# Patient Record
Sex: Female | Born: 1975 | Race: White | Hispanic: No | Marital: Married
Health system: Southern US, Community
[De-identification: ages and names within clinical notes are randomized; demographics above are authoritative.]

---

## 2014-12-27 ENCOUNTER — Other Ambulatory Visit: Payer: Self-pay | Admitting: Obstetrics and Gynecology

## 2014-12-27 DIAGNOSIS — R928 Other abnormal and inconclusive findings on diagnostic imaging of breast: Secondary | ICD-10-CM

## 2015-01-02 ENCOUNTER — Ambulatory Visit
Admission: RE | Admit: 2015-01-02 | Discharge: 2015-01-02 | Disposition: A | Discharge status: Home / Self Care | Payer: BLUE CROSS/BLUE SHIELD | Source: Ambulatory Visit | Attending: Obstetrics and Gynecology | Admitting: Obstetrics and Gynecology

## 2015-01-02 DIAGNOSIS — R928 Other abnormal and inconclusive findings on diagnostic imaging of breast: Secondary | ICD-10-CM

## 2018-01-15 DIAGNOSIS — Z124 Encounter for screening for malignant neoplasm of cervix: Secondary | ICD-10-CM | POA: Diagnosis not present

## 2018-01-15 DIAGNOSIS — Z01419 Encounter for gynecological examination (general) (routine) without abnormal findings: Secondary | ICD-10-CM | POA: Diagnosis not present

## 2018-01-15 DIAGNOSIS — Z1231 Encounter for screening mammogram for malignant neoplasm of breast: Secondary | ICD-10-CM | POA: Diagnosis not present

## 2018-01-15 DIAGNOSIS — Z130 Encounter for screening for diseases of the blood and blood-forming organs and certain disorders involving the immune mechanism: Secondary | ICD-10-CM | POA: Diagnosis not present

## 2018-01-15 DIAGNOSIS — R87610 Atypical squamous cells of undetermined significance on cytologic smear of cervix (ASC-US): Secondary | ICD-10-CM | POA: Diagnosis not present

## 2018-01-15 DIAGNOSIS — Z1151 Encounter for screening for human papillomavirus (HPV): Secondary | ICD-10-CM | POA: Diagnosis not present

## 2018-06-14 DIAGNOSIS — I10 Essential (primary) hypertension: Secondary | ICD-10-CM | POA: Diagnosis not present

## 2018-06-14 DIAGNOSIS — G8929 Other chronic pain: Secondary | ICD-10-CM | POA: Diagnosis not present

## 2018-06-14 DIAGNOSIS — M545 Low back pain: Secondary | ICD-10-CM | POA: Diagnosis not present

## 2018-06-14 DIAGNOSIS — F5101 Primary insomnia: Secondary | ICD-10-CM | POA: Diagnosis not present

## 2018-08-12 DIAGNOSIS — S51852A Open bite of left forearm, initial encounter: Secondary | ICD-10-CM | POA: Diagnosis not present

## 2018-08-12 DIAGNOSIS — Z23 Encounter for immunization: Secondary | ICD-10-CM | POA: Diagnosis not present

## 2018-08-13 DIAGNOSIS — S51852A Open bite of left forearm, initial encounter: Secondary | ICD-10-CM | POA: Diagnosis not present

## 2018-08-17 DIAGNOSIS — S51852A Open bite of left forearm, initial encounter: Secondary | ICD-10-CM | POA: Diagnosis not present

## 2018-08-26 DIAGNOSIS — R61 Generalized hyperhidrosis: Secondary | ICD-10-CM | POA: Diagnosis not present

## 2018-09-22 DIAGNOSIS — Z30433 Encounter for removal and reinsertion of intrauterine contraceptive device: Secondary | ICD-10-CM | POA: Diagnosis not present

## 2018-11-03 DIAGNOSIS — Z0000 Encounter for general adult medical examination without abnormal findings: Secondary | ICD-10-CM | POA: Diagnosis not present

## 2018-11-19 DIAGNOSIS — Z8249 Family history of ischemic heart disease and other diseases of the circulatory system: Secondary | ICD-10-CM | POA: Diagnosis not present

## 2018-11-19 DIAGNOSIS — I10 Essential (primary) hypertension: Secondary | ICD-10-CM | POA: Diagnosis not present

## 2018-12-15 ENCOUNTER — Other Ambulatory Visit: Payer: Self-pay

## 2018-12-15 DIAGNOSIS — Z20822 Contact with and (suspected) exposure to covid-19: Secondary | ICD-10-CM

## 2018-12-17 LAB — NOVEL CORONAVIRUS, NAA: SARS-CoV-2, NAA: NOT DETECTED

## 2019-03-17 DIAGNOSIS — Z130 Encounter for screening for diseases of the blood and blood-forming organs and certain disorders involving the immune mechanism: Secondary | ICD-10-CM | POA: Diagnosis not present

## 2019-03-17 DIAGNOSIS — Z1389 Encounter for screening for other disorder: Secondary | ICD-10-CM | POA: Diagnosis not present

## 2019-03-17 DIAGNOSIS — Z1231 Encounter for screening mammogram for malignant neoplasm of breast: Secondary | ICD-10-CM | POA: Diagnosis not present

## 2019-03-17 DIAGNOSIS — Z01419 Encounter for gynecological examination (general) (routine) without abnormal findings: Secondary | ICD-10-CM | POA: Diagnosis not present

## 2019-03-17 DIAGNOSIS — Z6824 Body mass index (BMI) 24.0-24.9, adult: Secondary | ICD-10-CM | POA: Diagnosis not present

## 2019-03-25 ENCOUNTER — Other Ambulatory Visit: Payer: Self-pay | Admitting: Obstetrics and Gynecology

## 2019-03-25 DIAGNOSIS — N6310 Unspecified lump in the right breast, unspecified quadrant: Secondary | ICD-10-CM

## 2019-04-06 DIAGNOSIS — Z85828 Personal history of other malignant neoplasm of skin: Secondary | ICD-10-CM | POA: Diagnosis not present

## 2019-04-06 DIAGNOSIS — L814 Other melanin hyperpigmentation: Secondary | ICD-10-CM | POA: Diagnosis not present

## 2019-04-06 DIAGNOSIS — D225 Melanocytic nevi of trunk: Secondary | ICD-10-CM | POA: Diagnosis not present

## 2019-04-06 DIAGNOSIS — L578 Other skin changes due to chronic exposure to nonionizing radiation: Secondary | ICD-10-CM | POA: Diagnosis not present

## 2019-04-06 DIAGNOSIS — L111 Transient acantholytic dermatosis [Grover]: Secondary | ICD-10-CM | POA: Diagnosis not present

## 2019-04-06 DIAGNOSIS — D485 Neoplasm of uncertain behavior of skin: Secondary | ICD-10-CM | POA: Diagnosis not present

## 2019-04-08 ENCOUNTER — Ambulatory Visit: Payer: BLUE CROSS/BLUE SHIELD | Attending: Internal Medicine

## 2019-04-08 DIAGNOSIS — Z23 Encounter for immunization: Secondary | ICD-10-CM

## 2019-04-08 NOTE — Progress Notes (Signed)
   Covid-19 Vaccination Clinic  Name:  Cheryl Zuniga    MRN: [id number] DOB: September 03, 1975  04/08/2019  [name] was observed post Covid-19 immunization for 15 minutes without incident. She was provided with Vaccine Information Sheet and instruction to access the V-Safe system.   [name] was instructed to call 911 with any severe reactions post vaccine: Marland Kitchen Difficulty breathing  . Swelling of face and throat  . A fast heartbeat  . A bad rash all over body  . Dizziness and weakness   Immunizations Administered    Name Date Dose VIS Date Route   Pfizer COVID-19 Vaccine 04/08/2019 10:55 AM 0.3 mL 12/24/2018 Intramuscular   Manufacturer: [employer], [employer]   Lot: [id number]   NDC: [address]-7

## 2019-04-15 DIAGNOSIS — M545 Low back pain: Secondary | ICD-10-CM | POA: Diagnosis not present

## 2019-04-15 DIAGNOSIS — M542 Cervicalgia: Secondary | ICD-10-CM | POA: Diagnosis not present

## 2019-04-19 ENCOUNTER — Ambulatory Visit
Admission: RE | Admit: 2019-04-19 | Discharge: 2019-04-19 | Disposition: A | Discharge status: Home / Self Care | Payer: BLUE CROSS/BLUE SHIELD | Source: Ambulatory Visit | Attending: Obstetrics and Gynecology | Admitting: Obstetrics and Gynecology

## 2019-04-19 ENCOUNTER — Other Ambulatory Visit: Payer: Self-pay

## 2019-04-19 ENCOUNTER — Ambulatory Visit
Admission: RE | Admit: 2019-04-19 | Discharge: 2019-04-19 | Disposition: A | Discharge status: Home / Self Care | Payer: BC Managed Care – PPO | Source: Ambulatory Visit | Attending: Obstetrics and Gynecology | Admitting: Obstetrics and Gynecology

## 2019-04-19 DIAGNOSIS — R922 Inconclusive mammogram: Secondary | ICD-10-CM | POA: Diagnosis not present

## 2019-04-19 DIAGNOSIS — N6310 Unspecified lump in the right breast, unspecified quadrant: Secondary | ICD-10-CM

## 2019-04-19 DIAGNOSIS — N6001 Solitary cyst of right breast: Secondary | ICD-10-CM | POA: Diagnosis not present

## 2019-05-02 ENCOUNTER — Ambulatory Visit: Payer: BC Managed Care – PPO | Attending: Internal Medicine

## 2019-05-02 DIAGNOSIS — Z23 Encounter for immunization: Secondary | ICD-10-CM

## 2019-05-02 NOTE — Progress Notes (Signed)
   Covid-19 Vaccination Clinic  Name:  Cheryl Zuniga    MRN: [id number] DOB: 1975/09/25  05/02/2019  [name] was observed post Covid-19 immunization for 15 minutes without incident. She was provided with Vaccine Information Sheet and instruction to access the V-Safe system.   [name] was instructed to call 911 with any severe reactions post vaccine: Marland Kitchen Difficulty breathing  . Swelling of face and throat  . A fast heartbeat  . A bad rash all over body  . Dizziness and weakness   Immunizations Administered    Name Date Dose VIS Date Route   Pfizer COVID-19 Vaccine 05/02/2019  3:31 PM 0.3 mL 03/09/2018 Intramuscular   Manufacturer: [employer], [employer]   Lot: [id number]   NDC: [address]-2

## 2019-08-17 DIAGNOSIS — M9903 Segmental and somatic dysfunction of lumbar region: Secondary | ICD-10-CM | POA: Diagnosis not present

## 2019-08-17 DIAGNOSIS — M50322 Other cervical disc degeneration at C5-C6 level: Secondary | ICD-10-CM | POA: Diagnosis not present

## 2019-08-17 DIAGNOSIS — M9905 Segmental and somatic dysfunction of pelvic region: Secondary | ICD-10-CM | POA: Diagnosis not present

## 2019-08-17 DIAGNOSIS — M9902 Segmental and somatic dysfunction of thoracic region: Secondary | ICD-10-CM | POA: Diagnosis not present

## 2019-08-18 DIAGNOSIS — M50322 Other cervical disc degeneration at C5-C6 level: Secondary | ICD-10-CM | POA: Diagnosis not present

## 2019-08-18 DIAGNOSIS — M9902 Segmental and somatic dysfunction of thoracic region: Secondary | ICD-10-CM | POA: Diagnosis not present

## 2019-08-18 DIAGNOSIS — M9903 Segmental and somatic dysfunction of lumbar region: Secondary | ICD-10-CM | POA: Diagnosis not present

## 2019-08-18 DIAGNOSIS — M9905 Segmental and somatic dysfunction of pelvic region: Secondary | ICD-10-CM | POA: Diagnosis not present

## 2019-08-19 DIAGNOSIS — M9902 Segmental and somatic dysfunction of thoracic region: Secondary | ICD-10-CM | POA: Diagnosis not present

## 2019-08-19 DIAGNOSIS — M9905 Segmental and somatic dysfunction of pelvic region: Secondary | ICD-10-CM | POA: Diagnosis not present

## 2019-08-19 DIAGNOSIS — M50322 Other cervical disc degeneration at C5-C6 level: Secondary | ICD-10-CM | POA: Diagnosis not present

## 2019-08-19 DIAGNOSIS — M9903 Segmental and somatic dysfunction of lumbar region: Secondary | ICD-10-CM | POA: Diagnosis not present

## 2019-08-22 DIAGNOSIS — M50322 Other cervical disc degeneration at C5-C6 level: Secondary | ICD-10-CM | POA: Diagnosis not present

## 2019-08-22 DIAGNOSIS — M9903 Segmental and somatic dysfunction of lumbar region: Secondary | ICD-10-CM | POA: Diagnosis not present

## 2019-08-22 DIAGNOSIS — M9902 Segmental and somatic dysfunction of thoracic region: Secondary | ICD-10-CM | POA: Diagnosis not present

## 2019-08-22 DIAGNOSIS — M9905 Segmental and somatic dysfunction of pelvic region: Secondary | ICD-10-CM | POA: Diagnosis not present

## 2019-08-24 DIAGNOSIS — M50322 Other cervical disc degeneration at C5-C6 level: Secondary | ICD-10-CM | POA: Diagnosis not present

## 2019-08-24 DIAGNOSIS — M9905 Segmental and somatic dysfunction of pelvic region: Secondary | ICD-10-CM | POA: Diagnosis not present

## 2019-08-24 DIAGNOSIS — M9902 Segmental and somatic dysfunction of thoracic region: Secondary | ICD-10-CM | POA: Diagnosis not present

## 2019-08-24 DIAGNOSIS — M9903 Segmental and somatic dysfunction of lumbar region: Secondary | ICD-10-CM | POA: Diagnosis not present

## 2019-08-26 DIAGNOSIS — M50322 Other cervical disc degeneration at C5-C6 level: Secondary | ICD-10-CM | POA: Diagnosis not present

## 2019-08-26 DIAGNOSIS — M9905 Segmental and somatic dysfunction of pelvic region: Secondary | ICD-10-CM | POA: Diagnosis not present

## 2019-08-26 DIAGNOSIS — M9902 Segmental and somatic dysfunction of thoracic region: Secondary | ICD-10-CM | POA: Diagnosis not present

## 2019-08-26 DIAGNOSIS — M9903 Segmental and somatic dysfunction of lumbar region: Secondary | ICD-10-CM | POA: Diagnosis not present

## 2019-08-29 DIAGNOSIS — M9902 Segmental and somatic dysfunction of thoracic region: Secondary | ICD-10-CM | POA: Diagnosis not present

## 2019-08-29 DIAGNOSIS — M50322 Other cervical disc degeneration at C5-C6 level: Secondary | ICD-10-CM | POA: Diagnosis not present

## 2019-08-29 DIAGNOSIS — M9905 Segmental and somatic dysfunction of pelvic region: Secondary | ICD-10-CM | POA: Diagnosis not present

## 2019-08-29 DIAGNOSIS — M9903 Segmental and somatic dysfunction of lumbar region: Secondary | ICD-10-CM | POA: Diagnosis not present

## 2019-08-31 DIAGNOSIS — R739 Hyperglycemia, unspecified: Secondary | ICD-10-CM | POA: Diagnosis not present

## 2019-08-31 DIAGNOSIS — F5101 Primary insomnia: Secondary | ICD-10-CM | POA: Diagnosis not present

## 2019-08-31 DIAGNOSIS — G8929 Other chronic pain: Secondary | ICD-10-CM | POA: Diagnosis not present

## 2019-08-31 DIAGNOSIS — I10 Essential (primary) hypertension: Secondary | ICD-10-CM | POA: Diagnosis not present

## 2019-09-01 DIAGNOSIS — M50322 Other cervical disc degeneration at C5-C6 level: Secondary | ICD-10-CM | POA: Diagnosis not present

## 2019-09-01 DIAGNOSIS — M9905 Segmental and somatic dysfunction of pelvic region: Secondary | ICD-10-CM | POA: Diagnosis not present

## 2019-09-01 DIAGNOSIS — M9903 Segmental and somatic dysfunction of lumbar region: Secondary | ICD-10-CM | POA: Diagnosis not present

## 2019-09-01 DIAGNOSIS — M9902 Segmental and somatic dysfunction of thoracic region: Secondary | ICD-10-CM | POA: Diagnosis not present

## 2019-09-02 DIAGNOSIS — M9903 Segmental and somatic dysfunction of lumbar region: Secondary | ICD-10-CM | POA: Diagnosis not present

## 2019-09-02 DIAGNOSIS — M9905 Segmental and somatic dysfunction of pelvic region: Secondary | ICD-10-CM | POA: Diagnosis not present

## 2019-09-02 DIAGNOSIS — M9902 Segmental and somatic dysfunction of thoracic region: Secondary | ICD-10-CM | POA: Diagnosis not present

## 2019-09-02 DIAGNOSIS — M50322 Other cervical disc degeneration at C5-C6 level: Secondary | ICD-10-CM | POA: Diagnosis not present

## 2019-09-05 DIAGNOSIS — M9903 Segmental and somatic dysfunction of lumbar region: Secondary | ICD-10-CM | POA: Diagnosis not present

## 2019-09-05 DIAGNOSIS — M9902 Segmental and somatic dysfunction of thoracic region: Secondary | ICD-10-CM | POA: Diagnosis not present

## 2019-09-05 DIAGNOSIS — M9905 Segmental and somatic dysfunction of pelvic region: Secondary | ICD-10-CM | POA: Diagnosis not present

## 2019-09-05 DIAGNOSIS — M50322 Other cervical disc degeneration at C5-C6 level: Secondary | ICD-10-CM | POA: Diagnosis not present

## 2019-09-07 DIAGNOSIS — M9903 Segmental and somatic dysfunction of lumbar region: Secondary | ICD-10-CM | POA: Diagnosis not present

## 2019-09-07 DIAGNOSIS — M9905 Segmental and somatic dysfunction of pelvic region: Secondary | ICD-10-CM | POA: Diagnosis not present

## 2019-09-07 DIAGNOSIS — M9902 Segmental and somatic dysfunction of thoracic region: Secondary | ICD-10-CM | POA: Diagnosis not present

## 2019-09-07 DIAGNOSIS — M50322 Other cervical disc degeneration at C5-C6 level: Secondary | ICD-10-CM | POA: Diagnosis not present

## 2019-09-20 DIAGNOSIS — M9902 Segmental and somatic dysfunction of thoracic region: Secondary | ICD-10-CM | POA: Diagnosis not present

## 2019-09-20 DIAGNOSIS — M50322 Other cervical disc degeneration at C5-C6 level: Secondary | ICD-10-CM | POA: Diagnosis not present

## 2019-09-20 DIAGNOSIS — M9905 Segmental and somatic dysfunction of pelvic region: Secondary | ICD-10-CM | POA: Diagnosis not present

## 2019-09-20 DIAGNOSIS — M9903 Segmental and somatic dysfunction of lumbar region: Secondary | ICD-10-CM | POA: Diagnosis not present

## 2019-10-06 DIAGNOSIS — M9905 Segmental and somatic dysfunction of pelvic region: Secondary | ICD-10-CM | POA: Diagnosis not present

## 2019-10-06 DIAGNOSIS — M50322 Other cervical disc degeneration at C5-C6 level: Secondary | ICD-10-CM | POA: Diagnosis not present

## 2019-10-06 DIAGNOSIS — M9903 Segmental and somatic dysfunction of lumbar region: Secondary | ICD-10-CM | POA: Diagnosis not present

## 2019-10-06 DIAGNOSIS — M9902 Segmental and somatic dysfunction of thoracic region: Secondary | ICD-10-CM | POA: Diagnosis not present

## 2020-03-02 DIAGNOSIS — I10 Essential (primary) hypertension: Secondary | ICD-10-CM | POA: Diagnosis not present

## 2020-03-02 DIAGNOSIS — F5101 Primary insomnia: Secondary | ICD-10-CM | POA: Diagnosis not present

## 2020-03-02 DIAGNOSIS — G8929 Other chronic pain: Secondary | ICD-10-CM | POA: Diagnosis not present

## 2020-04-11 DIAGNOSIS — Z6824 Body mass index (BMI) 24.0-24.9, adult: Secondary | ICD-10-CM | POA: Diagnosis not present

## 2020-04-11 DIAGNOSIS — Z1389 Encounter for screening for other disorder: Secondary | ICD-10-CM | POA: Diagnosis not present

## 2020-04-11 DIAGNOSIS — Z130 Encounter for screening for diseases of the blood and blood-forming organs and certain disorders involving the immune mechanism: Secondary | ICD-10-CM | POA: Diagnosis not present

## 2020-04-11 DIAGNOSIS — Z01419 Encounter for gynecological examination (general) (routine) without abnormal findings: Secondary | ICD-10-CM | POA: Diagnosis not present

## 2020-04-11 DIAGNOSIS — Z1231 Encounter for screening mammogram for malignant neoplasm of breast: Secondary | ICD-10-CM | POA: Diagnosis not present

## 2020-04-26 DIAGNOSIS — D1801 Hemangioma of skin and subcutaneous tissue: Secondary | ICD-10-CM | POA: Diagnosis not present

## 2020-04-26 DIAGNOSIS — L905 Scar conditions and fibrosis of skin: Secondary | ICD-10-CM | POA: Diagnosis not present

## 2020-04-26 DIAGNOSIS — L111 Transient acantholytic dermatosis [Grover]: Secondary | ICD-10-CM | POA: Diagnosis not present

## 2020-04-26 DIAGNOSIS — L814 Other melanin hyperpigmentation: Secondary | ICD-10-CM | POA: Diagnosis not present

## 2020-08-31 DIAGNOSIS — R739 Hyperglycemia, unspecified: Secondary | ICD-10-CM | POA: Diagnosis not present

## 2020-08-31 DIAGNOSIS — I10 Essential (primary) hypertension: Secondary | ICD-10-CM | POA: Diagnosis not present

## 2020-08-31 DIAGNOSIS — F5101 Primary insomnia: Secondary | ICD-10-CM | POA: Diagnosis not present

## 2020-08-31 DIAGNOSIS — G8929 Other chronic pain: Secondary | ICD-10-CM | POA: Diagnosis not present

## 2020-08-31 DIAGNOSIS — R002 Palpitations: Secondary | ICD-10-CM | POA: Diagnosis not present

## 2020-09-05 DIAGNOSIS — A630 Anogenital (venereal) warts: Secondary | ICD-10-CM | POA: Diagnosis not present

## 2020-09-07 IMAGING — MG MM DIGITAL DIAGNOSTIC UNILAT*R* W/ TOMO W/ CAD
4 series · 4 of 12 positions shown · non-contrast
Comparison: Previous exam(s).

CLINICAL DATA: Screening recall for a possible right breast mass.

EXAM:
DIGITAL DIAGNOSTIC RIGHT MAMMOGRAM WITH CAD AND TOMO
ULTRASOUND RIGHT BREAST

[R ML synth-2D]
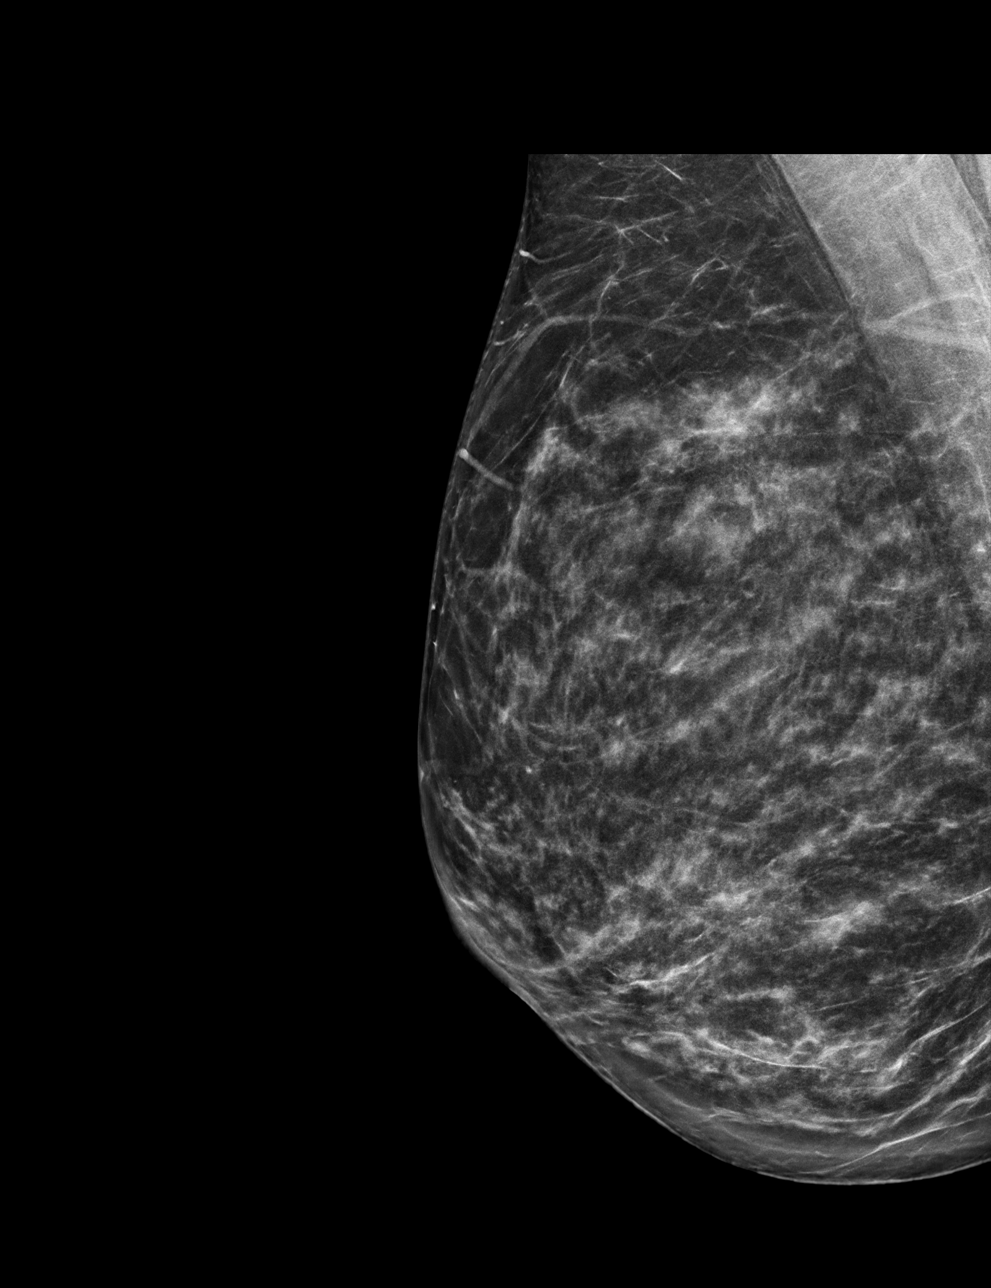

[R CC synth-2D]
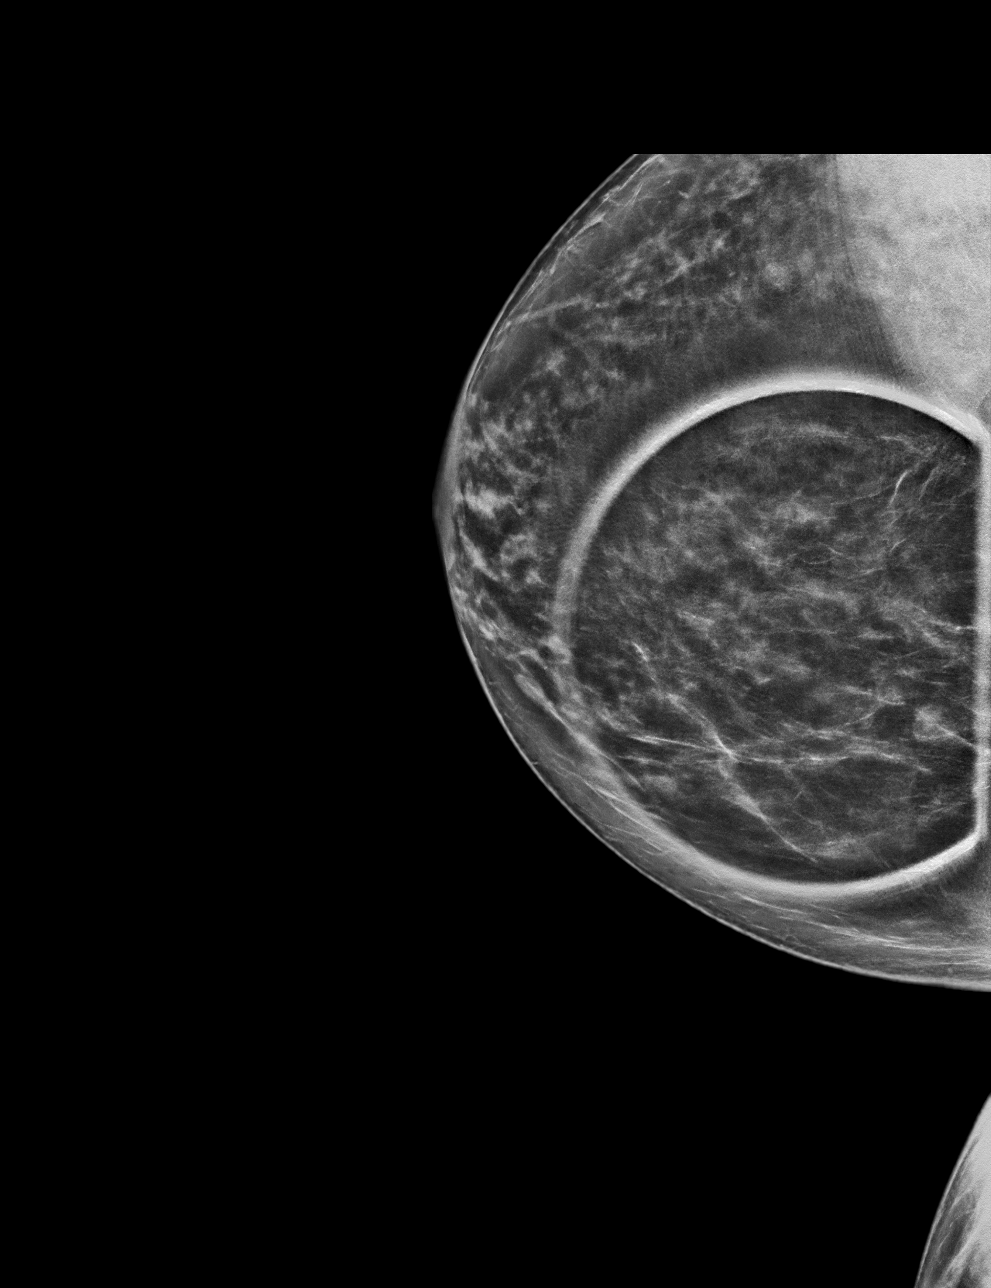

[R CC tomo · tomo slice 41/82.0]
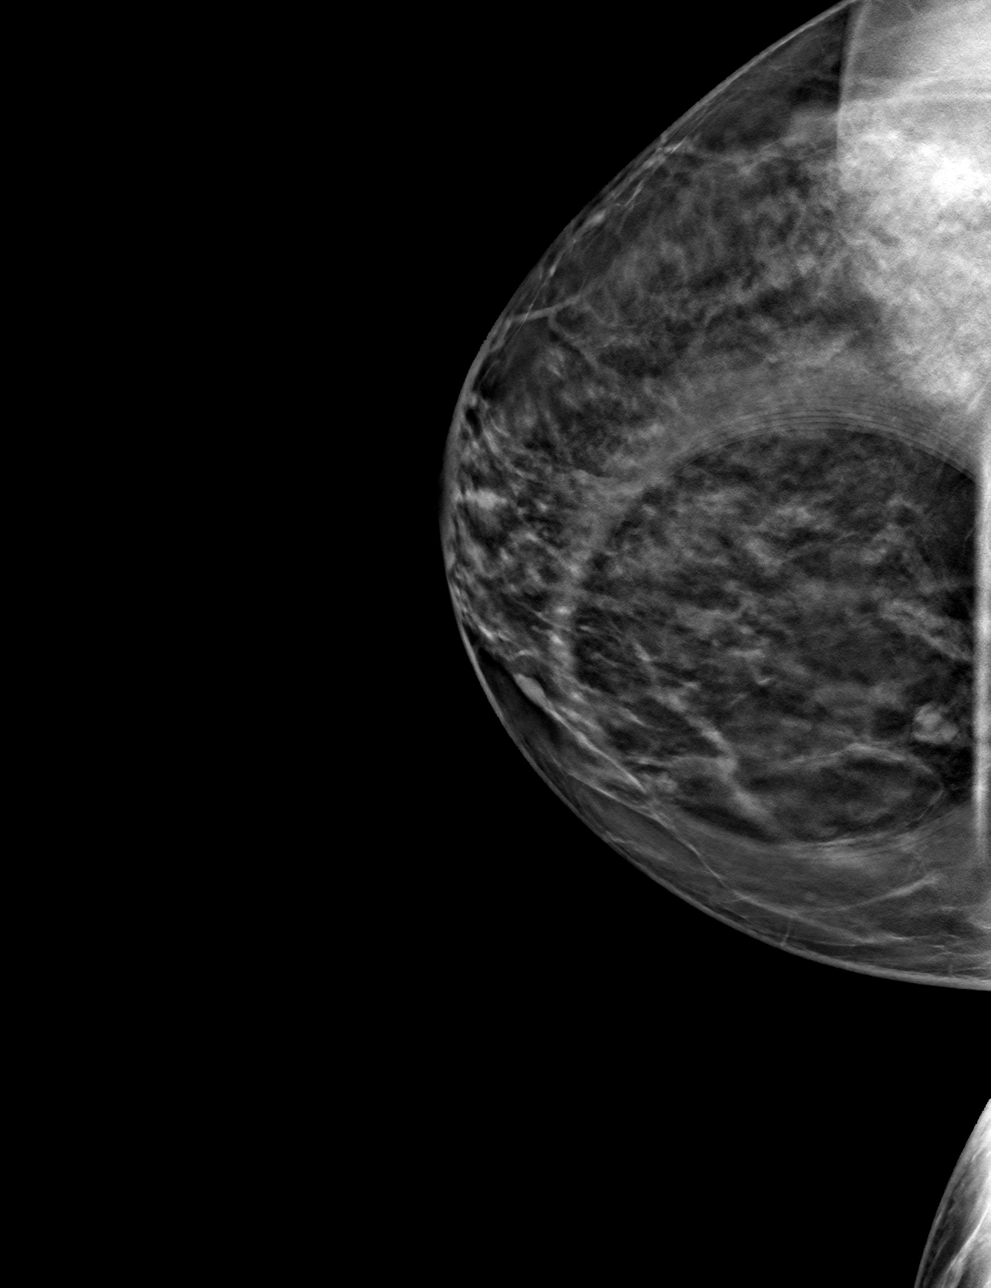

[R ML tomo · tomo slice 38/75.0]
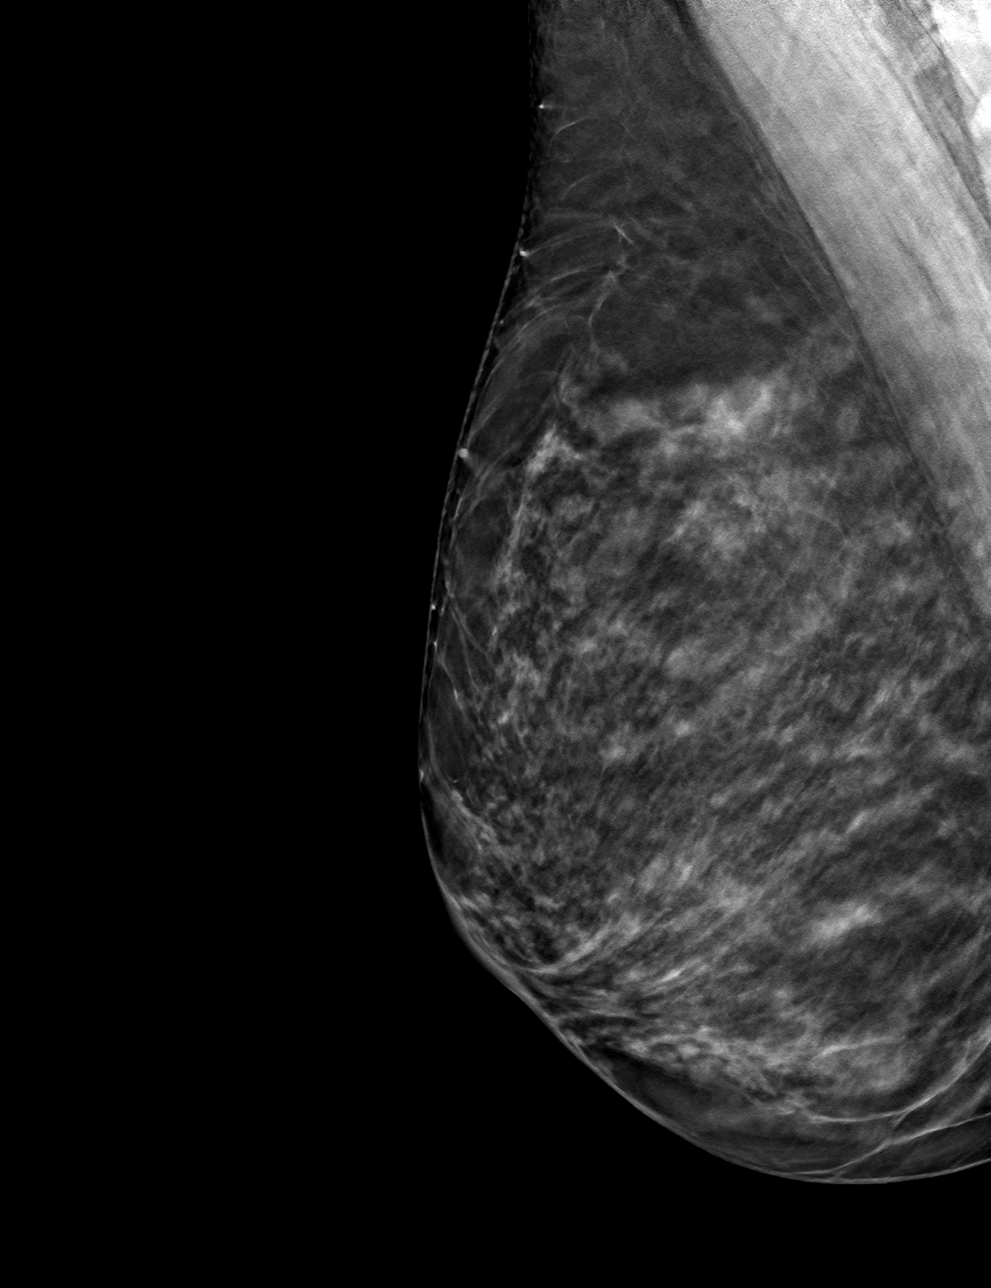

[4 of 12 positions shown; findings below may reference images not displayed]

ACR Breast Density Category c: The breast tissue is heterogeneously
dense, which may obscure small masses.
FINDINGS: Spot compression tomosynthesis images through the medial right
breast demonstrate a persistent round circumscribed mass measuring
approximately 7 mm.

Mammographic images were processed with CAD.

Ultrasound targeted to the right breast at 1 o'clock, 7 cm from the
nipple demonstrates an anechoic oval circumscribed mass measuring 9
x 5 x 8 mm.
IMPRESSION: The mass in the upper inner quadrant of the right breast corresponds
with a benign cyst.

RECOMMENDATION:
Screening mammogram in one year.(Code:[id number])

I have discussed the findings and recommendations with the patient.
If applicable, a reminder letter will be sent to the patient
regarding the next appointment.

BI-RADS CATEGORY  2: Benign.

## 2020-09-26 DIAGNOSIS — E875 Hyperkalemia: Secondary | ICD-10-CM | POA: Diagnosis not present

## 2020-10-26 DIAGNOSIS — D485 Neoplasm of uncertain behavior of skin: Secondary | ICD-10-CM | POA: Diagnosis not present

## 2020-10-26 DIAGNOSIS — L570 Actinic keratosis: Secondary | ICD-10-CM | POA: Diagnosis not present

## 2020-10-26 DIAGNOSIS — D2372 Other benign neoplasm of skin of left lower limb, including hip: Secondary | ICD-10-CM | POA: Diagnosis not present

## 2021-01-03 ENCOUNTER — Other Ambulatory Visit: Payer: Self-pay

## 2021-01-03 ENCOUNTER — Other Ambulatory Visit: Payer: Self-pay | Admitting: Family Medicine

## 2021-01-03 ENCOUNTER — Ambulatory Visit
Admission: RE | Admit: 2021-01-03 | Discharge: 2021-01-03 | Disposition: A | Discharge status: Home / Self Care | Payer: BC Managed Care – PPO | Source: Ambulatory Visit | Attending: Family Medicine | Admitting: Family Medicine

## 2021-01-03 DIAGNOSIS — M79674 Pain in right toe(s): Secondary | ICD-10-CM

## 2021-01-03 DIAGNOSIS — M19071 Primary osteoarthritis, right ankle and foot: Secondary | ICD-10-CM | POA: Diagnosis not present

## 2021-01-29 DIAGNOSIS — L74510 Primary focal hyperhidrosis, axilla: Secondary | ICD-10-CM | POA: Diagnosis not present

## 2021-01-29 DIAGNOSIS — L309 Dermatitis, unspecified: Secondary | ICD-10-CM | POA: Diagnosis not present

## 2021-01-29 DIAGNOSIS — L74512 Primary focal hyperhidrosis, palms: Secondary | ICD-10-CM | POA: Diagnosis not present

## 2021-01-29 DIAGNOSIS — L570 Actinic keratosis: Secondary | ICD-10-CM | POA: Diagnosis not present

## 2021-02-04 DIAGNOSIS — S92414A Nondisplaced fracture of proximal phalanx of right great toe, initial encounter for closed fracture: Secondary | ICD-10-CM | POA: Diagnosis not present

## 2021-02-04 DIAGNOSIS — M79671 Pain in right foot: Secondary | ICD-10-CM | POA: Diagnosis not present

## 2021-02-14 DIAGNOSIS — M79671 Pain in right foot: Secondary | ICD-10-CM | POA: Diagnosis not present

## 2021-02-20 DIAGNOSIS — M2021 Hallux rigidus, right foot: Secondary | ICD-10-CM | POA: Diagnosis not present

## 2021-03-18 DIAGNOSIS — G8929 Other chronic pain: Secondary | ICD-10-CM | POA: Diagnosis not present

## 2021-03-18 DIAGNOSIS — I10 Essential (primary) hypertension: Secondary | ICD-10-CM | POA: Diagnosis not present

## 2021-03-18 DIAGNOSIS — F5101 Primary insomnia: Secondary | ICD-10-CM | POA: Diagnosis not present

## 2021-03-18 DIAGNOSIS — Z131 Encounter for screening for diabetes mellitus: Secondary | ICD-10-CM | POA: Diagnosis not present

## 2021-04-24 DIAGNOSIS — Z124 Encounter for screening for malignant neoplasm of cervix: Secondary | ICD-10-CM | POA: Diagnosis not present

## 2021-04-24 DIAGNOSIS — Z1272 Encounter for screening for malignant neoplasm of vagina: Secondary | ICD-10-CM | POA: Diagnosis not present

## 2021-04-24 DIAGNOSIS — Z01419 Encounter for gynecological examination (general) (routine) without abnormal findings: Secondary | ICD-10-CM | POA: Diagnosis not present

## 2021-04-24 DIAGNOSIS — Z0142 Encounter for cervical smear to confirm findings of recent normal smear following initial abnormal smear: Secondary | ICD-10-CM | POA: Diagnosis not present

## 2021-04-24 DIAGNOSIS — Z1231 Encounter for screening mammogram for malignant neoplasm of breast: Secondary | ICD-10-CM | POA: Diagnosis not present

## 2021-04-24 DIAGNOSIS — Z130 Encounter for screening for diseases of the blood and blood-forming organs and certain disorders involving the immune mechanism: Secondary | ICD-10-CM | POA: Diagnosis not present

## 2021-05-01 DIAGNOSIS — L309 Dermatitis, unspecified: Secondary | ICD-10-CM | POA: Diagnosis not present

## 2021-05-01 DIAGNOSIS — D2272 Melanocytic nevi of left lower limb, including hip: Secondary | ICD-10-CM | POA: Diagnosis not present

## 2021-05-01 DIAGNOSIS — D485 Neoplasm of uncertain behavior of skin: Secondary | ICD-10-CM | POA: Diagnosis not present

## 2021-05-01 DIAGNOSIS — D225 Melanocytic nevi of trunk: Secondary | ICD-10-CM | POA: Diagnosis not present

## 2021-05-01 DIAGNOSIS — L814 Other melanin hyperpigmentation: Secondary | ICD-10-CM | POA: Diagnosis not present

## 2021-09-23 DIAGNOSIS — F5101 Primary insomnia: Secondary | ICD-10-CM | POA: Diagnosis not present

## 2021-09-23 DIAGNOSIS — I10 Essential (primary) hypertension: Secondary | ICD-10-CM | POA: Diagnosis not present

## 2021-09-23 DIAGNOSIS — R002 Palpitations: Secondary | ICD-10-CM | POA: Diagnosis not present

## 2021-09-23 DIAGNOSIS — G8929 Other chronic pain: Secondary | ICD-10-CM | POA: Diagnosis not present

## 2021-10-26 DIAGNOSIS — S61451A Open bite of right hand, initial encounter: Secondary | ICD-10-CM | POA: Diagnosis not present

## 2021-10-26 DIAGNOSIS — W5501XA Bitten by cat, initial encounter: Secondary | ICD-10-CM | POA: Diagnosis not present

## 2022-01-25 DIAGNOSIS — N390 Urinary tract infection, site not specified: Secondary | ICD-10-CM | POA: Diagnosis not present

## 2022-01-25 DIAGNOSIS — R350 Frequency of micturition: Secondary | ICD-10-CM | POA: Diagnosis not present

## 2022-02-20 DIAGNOSIS — Z1211 Encounter for screening for malignant neoplasm of colon: Secondary | ICD-10-CM | POA: Diagnosis not present

## 2022-02-20 DIAGNOSIS — R61 Generalized hyperhidrosis: Secondary | ICD-10-CM | POA: Diagnosis not present

## 2022-02-20 DIAGNOSIS — I10 Essential (primary) hypertension: Secondary | ICD-10-CM | POA: Diagnosis not present

## 2022-03-25 DIAGNOSIS — I10 Essential (primary) hypertension: Secondary | ICD-10-CM | POA: Diagnosis not present

## 2022-03-25 DIAGNOSIS — F5101 Primary insomnia: Secondary | ICD-10-CM | POA: Diagnosis not present

## 2022-03-25 DIAGNOSIS — R002 Palpitations: Secondary | ICD-10-CM | POA: Diagnosis not present

## 2022-03-25 DIAGNOSIS — G8929 Other chronic pain: Secondary | ICD-10-CM | POA: Diagnosis not present

## 2022-04-25 DIAGNOSIS — D128 Benign neoplasm of rectum: Secondary | ICD-10-CM | POA: Diagnosis not present

## 2022-04-25 DIAGNOSIS — Z1211 Encounter for screening for malignant neoplasm of colon: Secondary | ICD-10-CM | POA: Diagnosis not present

## 2022-04-25 DIAGNOSIS — K644 Residual hemorrhoidal skin tags: Secondary | ICD-10-CM | POA: Diagnosis not present

## 2022-04-25 DIAGNOSIS — D122 Benign neoplasm of ascending colon: Secondary | ICD-10-CM | POA: Diagnosis not present

## 2022-04-25 DIAGNOSIS — K621 Rectal polyp: Secondary | ICD-10-CM | POA: Diagnosis not present

## 2022-04-25 DIAGNOSIS — K635 Polyp of colon: Secondary | ICD-10-CM | POA: Diagnosis not present

## 2022-05-01 DIAGNOSIS — Z1231 Encounter for screening mammogram for malignant neoplasm of breast: Secondary | ICD-10-CM | POA: Diagnosis not present

## 2022-05-01 DIAGNOSIS — Z01419 Encounter for gynecological examination (general) (routine) without abnormal findings: Secondary | ICD-10-CM | POA: Diagnosis not present

## 2022-05-06 DIAGNOSIS — L814 Other melanin hyperpigmentation: Secondary | ICD-10-CM | POA: Diagnosis not present

## 2022-05-06 DIAGNOSIS — L821 Other seborrheic keratosis: Secondary | ICD-10-CM | POA: Diagnosis not present

## 2022-05-06 DIAGNOSIS — L718 Other rosacea: Secondary | ICD-10-CM | POA: Diagnosis not present

## 2022-05-06 DIAGNOSIS — D225 Melanocytic nevi of trunk: Secondary | ICD-10-CM | POA: Diagnosis not present

## 2022-05-12 DIAGNOSIS — A630 Anogenital (venereal) warts: Secondary | ICD-10-CM | POA: Diagnosis not present

## 2022-10-01 DIAGNOSIS — G8929 Other chronic pain: Secondary | ICD-10-CM | POA: Diagnosis not present

## 2022-10-01 DIAGNOSIS — I10 Essential (primary) hypertension: Secondary | ICD-10-CM | POA: Diagnosis not present

## 2022-10-01 DIAGNOSIS — R002 Palpitations: Secondary | ICD-10-CM | POA: Diagnosis not present

## 2022-10-01 DIAGNOSIS — F5101 Primary insomnia: Secondary | ICD-10-CM | POA: Diagnosis not present

## 2023-04-30 DIAGNOSIS — Z01419 Encounter for gynecological examination (general) (routine) without abnormal findings: Secondary | ICD-10-CM | POA: Diagnosis not present

## 2023-04-30 DIAGNOSIS — A630 Anogenital (venereal) warts: Secondary | ICD-10-CM | POA: Diagnosis not present

## 2023-04-30 DIAGNOSIS — Z1231 Encounter for screening mammogram for malignant neoplasm of breast: Secondary | ICD-10-CM | POA: Diagnosis not present

## 2023-05-06 DIAGNOSIS — M47812 Spondylosis without myelopathy or radiculopathy, cervical region: Secondary | ICD-10-CM | POA: Diagnosis not present

## 2023-05-06 DIAGNOSIS — L821 Other seborrheic keratosis: Secondary | ICD-10-CM | POA: Diagnosis not present

## 2023-05-06 DIAGNOSIS — M9903 Segmental and somatic dysfunction of lumbar region: Secondary | ICD-10-CM | POA: Diagnosis not present

## 2023-05-06 DIAGNOSIS — M9902 Segmental and somatic dysfunction of thoracic region: Secondary | ICD-10-CM | POA: Diagnosis not present

## 2023-05-06 DIAGNOSIS — M546 Pain in thoracic spine: Secondary | ICD-10-CM | POA: Diagnosis not present

## 2023-05-06 DIAGNOSIS — M47814 Spondylosis without myelopathy or radiculopathy, thoracic region: Secondary | ICD-10-CM | POA: Diagnosis not present

## 2023-05-06 DIAGNOSIS — D225 Melanocytic nevi of trunk: Secondary | ICD-10-CM | POA: Diagnosis not present

## 2023-05-06 DIAGNOSIS — M62411 Contracture of muscle, right shoulder: Secondary | ICD-10-CM | POA: Diagnosis not present

## 2023-05-06 DIAGNOSIS — M47816 Spondylosis without myelopathy or radiculopathy, lumbar region: Secondary | ICD-10-CM | POA: Diagnosis not present

## 2023-05-06 DIAGNOSIS — L538 Other specified erythematous conditions: Secondary | ICD-10-CM | POA: Diagnosis not present

## 2023-05-06 DIAGNOSIS — L820 Inflamed seborrheic keratosis: Secondary | ICD-10-CM | POA: Diagnosis not present

## 2023-05-06 DIAGNOSIS — M9901 Segmental and somatic dysfunction of cervical region: Secondary | ICD-10-CM | POA: Diagnosis not present

## 2023-05-06 DIAGNOSIS — L74512 Primary focal hyperhidrosis, palms: Secondary | ICD-10-CM | POA: Diagnosis not present

## 2023-05-06 DIAGNOSIS — L814 Other melanin hyperpigmentation: Secondary | ICD-10-CM | POA: Diagnosis not present

## 2023-05-11 DIAGNOSIS — M9902 Segmental and somatic dysfunction of thoracic region: Secondary | ICD-10-CM | POA: Diagnosis not present

## 2023-05-11 DIAGNOSIS — M62411 Contracture of muscle, right shoulder: Secondary | ICD-10-CM | POA: Diagnosis not present

## 2023-05-11 DIAGNOSIS — M9901 Segmental and somatic dysfunction of cervical region: Secondary | ICD-10-CM | POA: Diagnosis not present

## 2023-05-11 DIAGNOSIS — M546 Pain in thoracic spine: Secondary | ICD-10-CM | POA: Diagnosis not present

## 2023-05-11 DIAGNOSIS — M9903 Segmental and somatic dysfunction of lumbar region: Secondary | ICD-10-CM | POA: Diagnosis not present

## 2023-05-18 DIAGNOSIS — M9903 Segmental and somatic dysfunction of lumbar region: Secondary | ICD-10-CM | POA: Diagnosis not present

## 2023-05-18 DIAGNOSIS — M9902 Segmental and somatic dysfunction of thoracic region: Secondary | ICD-10-CM | POA: Diagnosis not present

## 2023-05-18 DIAGNOSIS — M50322 Other cervical disc degeneration at C5-C6 level: Secondary | ICD-10-CM | POA: Diagnosis not present

## 2023-05-18 DIAGNOSIS — M9905 Segmental and somatic dysfunction of pelvic region: Secondary | ICD-10-CM | POA: Diagnosis not present

## 2023-05-18 DIAGNOSIS — M62411 Contracture of muscle, right shoulder: Secondary | ICD-10-CM | POA: Diagnosis not present

## 2023-05-18 DIAGNOSIS — M9901 Segmental and somatic dysfunction of cervical region: Secondary | ICD-10-CM | POA: Diagnosis not present

## 2023-05-22 DIAGNOSIS — M9901 Segmental and somatic dysfunction of cervical region: Secondary | ICD-10-CM | POA: Diagnosis not present

## 2023-05-22 DIAGNOSIS — M9902 Segmental and somatic dysfunction of thoracic region: Secondary | ICD-10-CM | POA: Diagnosis not present

## 2023-05-22 DIAGNOSIS — M9903 Segmental and somatic dysfunction of lumbar region: Secondary | ICD-10-CM | POA: Diagnosis not present

## 2023-05-22 DIAGNOSIS — M9905 Segmental and somatic dysfunction of pelvic region: Secondary | ICD-10-CM | POA: Diagnosis not present

## 2023-05-22 DIAGNOSIS — M50322 Other cervical disc degeneration at C5-C6 level: Secondary | ICD-10-CM | POA: Diagnosis not present

## 2023-05-22 DIAGNOSIS — M62411 Contracture of muscle, right shoulder: Secondary | ICD-10-CM | POA: Diagnosis not present

## 2023-05-26 DIAGNOSIS — M9902 Segmental and somatic dysfunction of thoracic region: Secondary | ICD-10-CM | POA: Diagnosis not present

## 2023-05-26 DIAGNOSIS — M50322 Other cervical disc degeneration at C5-C6 level: Secondary | ICD-10-CM | POA: Diagnosis not present

## 2023-05-26 DIAGNOSIS — M9905 Segmental and somatic dysfunction of pelvic region: Secondary | ICD-10-CM | POA: Diagnosis not present

## 2023-05-26 DIAGNOSIS — M9901 Segmental and somatic dysfunction of cervical region: Secondary | ICD-10-CM | POA: Diagnosis not present

## 2023-05-26 DIAGNOSIS — M62411 Contracture of muscle, right shoulder: Secondary | ICD-10-CM | POA: Diagnosis not present

## 2023-05-26 DIAGNOSIS — M9903 Segmental and somatic dysfunction of lumbar region: Secondary | ICD-10-CM | POA: Diagnosis not present

## 2023-06-02 DIAGNOSIS — M62411 Contracture of muscle, right shoulder: Secondary | ICD-10-CM | POA: Diagnosis not present

## 2023-06-02 DIAGNOSIS — M50322 Other cervical disc degeneration at C5-C6 level: Secondary | ICD-10-CM | POA: Diagnosis not present

## 2023-06-02 DIAGNOSIS — M9901 Segmental and somatic dysfunction of cervical region: Secondary | ICD-10-CM | POA: Diagnosis not present

## 2023-06-02 DIAGNOSIS — M9905 Segmental and somatic dysfunction of pelvic region: Secondary | ICD-10-CM | POA: Diagnosis not present

## 2023-06-11 DIAGNOSIS — M9901 Segmental and somatic dysfunction of cervical region: Secondary | ICD-10-CM | POA: Diagnosis not present

## 2023-06-11 DIAGNOSIS — M50322 Other cervical disc degeneration at C5-C6 level: Secondary | ICD-10-CM | POA: Diagnosis not present

## 2023-06-11 DIAGNOSIS — M62411 Contracture of muscle, right shoulder: Secondary | ICD-10-CM | POA: Diagnosis not present

## 2023-06-11 DIAGNOSIS — M9905 Segmental and somatic dysfunction of pelvic region: Secondary | ICD-10-CM | POA: Diagnosis not present

## 2023-06-23 DIAGNOSIS — M62411 Contracture of muscle, right shoulder: Secondary | ICD-10-CM | POA: Diagnosis not present

## 2023-06-23 DIAGNOSIS — M9905 Segmental and somatic dysfunction of pelvic region: Secondary | ICD-10-CM | POA: Diagnosis not present

## 2023-06-23 DIAGNOSIS — M50322 Other cervical disc degeneration at C5-C6 level: Secondary | ICD-10-CM | POA: Diagnosis not present

## 2023-06-23 DIAGNOSIS — M9901 Segmental and somatic dysfunction of cervical region: Secondary | ICD-10-CM | POA: Diagnosis not present

## 2023-06-26 DIAGNOSIS — M9901 Segmental and somatic dysfunction of cervical region: Secondary | ICD-10-CM | POA: Diagnosis not present

## 2023-06-26 DIAGNOSIS — M50322 Other cervical disc degeneration at C5-C6 level: Secondary | ICD-10-CM | POA: Diagnosis not present

## 2023-06-26 DIAGNOSIS — M9905 Segmental and somatic dysfunction of pelvic region: Secondary | ICD-10-CM | POA: Diagnosis not present

## 2023-06-26 DIAGNOSIS — M62411 Contracture of muscle, right shoulder: Secondary | ICD-10-CM | POA: Diagnosis not present

## 2023-06-30 DIAGNOSIS — M50322 Other cervical disc degeneration at C5-C6 level: Secondary | ICD-10-CM | POA: Diagnosis not present

## 2023-06-30 DIAGNOSIS — M9901 Segmental and somatic dysfunction of cervical region: Secondary | ICD-10-CM | POA: Diagnosis not present

## 2023-06-30 DIAGNOSIS — M62411 Contracture of muscle, right shoulder: Secondary | ICD-10-CM | POA: Diagnosis not present

## 2023-06-30 DIAGNOSIS — M9905 Segmental and somatic dysfunction of pelvic region: Secondary | ICD-10-CM | POA: Diagnosis not present

## 2023-07-07 DIAGNOSIS — M50322 Other cervical disc degeneration at C5-C6 level: Secondary | ICD-10-CM | POA: Diagnosis not present

## 2023-07-07 DIAGNOSIS — M9901 Segmental and somatic dysfunction of cervical region: Secondary | ICD-10-CM | POA: Diagnosis not present

## 2023-07-07 DIAGNOSIS — M62411 Contracture of muscle, right shoulder: Secondary | ICD-10-CM | POA: Diagnosis not present

## 2023-07-07 DIAGNOSIS — M9905 Segmental and somatic dysfunction of pelvic region: Secondary | ICD-10-CM | POA: Diagnosis not present

## 2023-07-21 DIAGNOSIS — M50322 Other cervical disc degeneration at C5-C6 level: Secondary | ICD-10-CM | POA: Diagnosis not present

## 2023-07-21 DIAGNOSIS — M9905 Segmental and somatic dysfunction of pelvic region: Secondary | ICD-10-CM | POA: Diagnosis not present

## 2023-07-21 DIAGNOSIS — M9901 Segmental and somatic dysfunction of cervical region: Secondary | ICD-10-CM | POA: Diagnosis not present

## 2023-07-21 DIAGNOSIS — M62411 Contracture of muscle, right shoulder: Secondary | ICD-10-CM | POA: Diagnosis not present

## 2023-07-24 DIAGNOSIS — M50322 Other cervical disc degeneration at C5-C6 level: Secondary | ICD-10-CM | POA: Diagnosis not present

## 2023-07-24 DIAGNOSIS — M9905 Segmental and somatic dysfunction of pelvic region: Secondary | ICD-10-CM | POA: Diagnosis not present

## 2023-07-24 DIAGNOSIS — M62411 Contracture of muscle, right shoulder: Secondary | ICD-10-CM | POA: Diagnosis not present

## 2023-07-24 DIAGNOSIS — M9901 Segmental and somatic dysfunction of cervical region: Secondary | ICD-10-CM | POA: Diagnosis not present

## 2023-08-04 DIAGNOSIS — M9901 Segmental and somatic dysfunction of cervical region: Secondary | ICD-10-CM | POA: Diagnosis not present

## 2023-08-04 DIAGNOSIS — M50322 Other cervical disc degeneration at C5-C6 level: Secondary | ICD-10-CM | POA: Diagnosis not present

## 2023-08-04 DIAGNOSIS — M62411 Contracture of muscle, right shoulder: Secondary | ICD-10-CM | POA: Diagnosis not present

## 2023-08-04 DIAGNOSIS — M9905 Segmental and somatic dysfunction of pelvic region: Secondary | ICD-10-CM | POA: Diagnosis not present

## 2023-08-11 DIAGNOSIS — M50322 Other cervical disc degeneration at C5-C6 level: Secondary | ICD-10-CM | POA: Diagnosis not present

## 2023-08-11 DIAGNOSIS — M62411 Contracture of muscle, right shoulder: Secondary | ICD-10-CM | POA: Diagnosis not present

## 2023-08-11 DIAGNOSIS — M9905 Segmental and somatic dysfunction of pelvic region: Secondary | ICD-10-CM | POA: Diagnosis not present

## 2023-08-11 DIAGNOSIS — M9901 Segmental and somatic dysfunction of cervical region: Secondary | ICD-10-CM | POA: Diagnosis not present

## 2023-09-04 DIAGNOSIS — M9901 Segmental and somatic dysfunction of cervical region: Secondary | ICD-10-CM | POA: Diagnosis not present

## 2023-09-04 DIAGNOSIS — M62411 Contracture of muscle, right shoulder: Secondary | ICD-10-CM | POA: Diagnosis not present

## 2023-09-04 DIAGNOSIS — M50322 Other cervical disc degeneration at C5-C6 level: Secondary | ICD-10-CM | POA: Diagnosis not present

## 2023-09-04 DIAGNOSIS — M9905 Segmental and somatic dysfunction of pelvic region: Secondary | ICD-10-CM | POA: Diagnosis not present

## 2023-09-07 DIAGNOSIS — M9901 Segmental and somatic dysfunction of cervical region: Secondary | ICD-10-CM | POA: Diagnosis not present

## 2023-09-07 DIAGNOSIS — M62411 Contracture of muscle, right shoulder: Secondary | ICD-10-CM | POA: Diagnosis not present

## 2023-09-07 DIAGNOSIS — M9905 Segmental and somatic dysfunction of pelvic region: Secondary | ICD-10-CM | POA: Diagnosis not present

## 2023-09-07 DIAGNOSIS — M50322 Other cervical disc degeneration at C5-C6 level: Secondary | ICD-10-CM | POA: Diagnosis not present

## 2023-09-11 DIAGNOSIS — M9905 Segmental and somatic dysfunction of pelvic region: Secondary | ICD-10-CM | POA: Diagnosis not present

## 2023-09-11 DIAGNOSIS — M62411 Contracture of muscle, right shoulder: Secondary | ICD-10-CM | POA: Diagnosis not present

## 2023-09-11 DIAGNOSIS — M50322 Other cervical disc degeneration at C5-C6 level: Secondary | ICD-10-CM | POA: Diagnosis not present

## 2023-09-11 DIAGNOSIS — M9901 Segmental and somatic dysfunction of cervical region: Secondary | ICD-10-CM | POA: Diagnosis not present

## 2023-09-16 DIAGNOSIS — M9905 Segmental and somatic dysfunction of pelvic region: Secondary | ICD-10-CM | POA: Diagnosis not present

## 2023-09-16 DIAGNOSIS — M50322 Other cervical disc degeneration at C5-C6 level: Secondary | ICD-10-CM | POA: Diagnosis not present

## 2023-09-16 DIAGNOSIS — M62411 Contracture of muscle, right shoulder: Secondary | ICD-10-CM | POA: Diagnosis not present

## 2023-09-16 DIAGNOSIS — M9901 Segmental and somatic dysfunction of cervical region: Secondary | ICD-10-CM | POA: Diagnosis not present

## 2023-09-22 DIAGNOSIS — M9901 Segmental and somatic dysfunction of cervical region: Secondary | ICD-10-CM | POA: Diagnosis not present

## 2023-09-22 DIAGNOSIS — M62411 Contracture of muscle, right shoulder: Secondary | ICD-10-CM | POA: Diagnosis not present

## 2023-09-22 DIAGNOSIS — M9905 Segmental and somatic dysfunction of pelvic region: Secondary | ICD-10-CM | POA: Diagnosis not present

## 2023-09-22 DIAGNOSIS — M50322 Other cervical disc degeneration at C5-C6 level: Secondary | ICD-10-CM | POA: Diagnosis not present

## 2023-09-29 DIAGNOSIS — I10 Essential (primary) hypertension: Secondary | ICD-10-CM | POA: Diagnosis not present

## 2023-09-29 DIAGNOSIS — G8929 Other chronic pain: Secondary | ICD-10-CM | POA: Diagnosis not present

## 2023-09-29 DIAGNOSIS — R002 Palpitations: Secondary | ICD-10-CM | POA: Diagnosis not present

## 2023-09-29 DIAGNOSIS — F5101 Primary insomnia: Secondary | ICD-10-CM | POA: Diagnosis not present

## 2023-09-30 DIAGNOSIS — M9901 Segmental and somatic dysfunction of cervical region: Secondary | ICD-10-CM | POA: Diagnosis not present

## 2023-09-30 DIAGNOSIS — M50322 Other cervical disc degeneration at C5-C6 level: Secondary | ICD-10-CM | POA: Diagnosis not present

## 2023-09-30 DIAGNOSIS — M62411 Contracture of muscle, right shoulder: Secondary | ICD-10-CM | POA: Diagnosis not present

## 2023-09-30 DIAGNOSIS — M9905 Segmental and somatic dysfunction of pelvic region: Secondary | ICD-10-CM | POA: Diagnosis not present

## 2023-10-07 DIAGNOSIS — E875 Hyperkalemia: Secondary | ICD-10-CM | POA: Diagnosis not present
